# Patient Record
Sex: Female | Born: 1945 | Race: White | Smoking: Never smoker
Health system: Southern US, Community
[De-identification: ages and names within clinical notes are randomized; demographics above are authoritative.]

## PROBLEM LIST (undated history)

## (undated) DIAGNOSIS — I10 Essential (primary) hypertension: Secondary | ICD-10-CM

## (undated) DIAGNOSIS — E039 Hypothyroidism, unspecified: Secondary | ICD-10-CM

## (undated) DIAGNOSIS — E785 Hyperlipidemia, unspecified: Secondary | ICD-10-CM

## (undated) DIAGNOSIS — D696 Thrombocytopenia, unspecified: Secondary | ICD-10-CM

## (undated) HISTORY — PX: LITHOTRIPSY: SUR834

## (undated) HISTORY — DX: Thrombocytopenia, unspecified: D69.6

## (undated) HISTORY — PX: TONSILLECTOMY: SUR1361

## (undated) HISTORY — DX: Essential (primary) hypertension: I10

## (undated) HISTORY — PX: BREAST BIOPSY: SHX20

## (undated) HISTORY — DX: Hypothyroidism, unspecified: E03.9

## (undated) HISTORY — DX: Hyperlipidemia, unspecified: E78.5

---

## 2004-07-10 HISTORY — PX: COLONOSCOPY: SHX174

## 2015-07-11 HISTORY — PX: BREAST BIOPSY: SHX20

## 2016-05-26 ENCOUNTER — Other Ambulatory Visit: Payer: Self-pay | Admitting: Family Medicine

## 2016-05-26 DIAGNOSIS — R921 Mammographic calcification found on diagnostic imaging of breast: Secondary | ICD-10-CM

## 2016-05-31 ENCOUNTER — Other Ambulatory Visit: Payer: Self-pay | Admitting: Family Medicine

## 2016-05-31 DIAGNOSIS — R921 Mammographic calcification found on diagnostic imaging of breast: Secondary | ICD-10-CM

## 2016-06-09 ENCOUNTER — Ambulatory Visit
Admission: RE | Admit: 2016-06-09 | Discharge: 2016-06-09 | Disposition: A | Discharge status: Home / Self Care | Payer: Medicare Other | Source: Ambulatory Visit | Attending: Family Medicine | Admitting: Family Medicine

## 2016-06-09 DIAGNOSIS — R921 Mammographic calcification found on diagnostic imaging of breast: Secondary | ICD-10-CM

## 2017-07-06 ENCOUNTER — Other Ambulatory Visit: Payer: Self-pay | Admitting: Family Medicine

## 2017-07-06 DIAGNOSIS — Z1231 Encounter for screening mammogram for malignant neoplasm of breast: Secondary | ICD-10-CM

## 2017-07-11 ENCOUNTER — Ambulatory Visit
Admission: RE | Admit: 2017-07-11 | Discharge: 2017-07-11 | Disposition: A | Discharge status: Home / Self Care | Payer: Medicare Other | Source: Ambulatory Visit | Attending: Family Medicine | Admitting: Family Medicine

## 2017-07-11 DIAGNOSIS — Z1231 Encounter for screening mammogram for malignant neoplasm of breast: Secondary | ICD-10-CM

## 2018-06-14 ENCOUNTER — Other Ambulatory Visit: Payer: Self-pay | Admitting: Family Medicine

## 2018-06-14 DIAGNOSIS — Z1231 Encounter for screening mammogram for malignant neoplasm of breast: Secondary | ICD-10-CM

## 2018-07-26 ENCOUNTER — Ambulatory Visit
Admission: RE | Admit: 2018-07-26 | Discharge: 2018-07-26 | Disposition: A | Discharge status: Home / Self Care | Payer: Medicare Other | Source: Ambulatory Visit | Attending: Family Medicine | Admitting: Family Medicine

## 2018-07-26 ENCOUNTER — Ambulatory Visit: Payer: Medicare Other

## 2018-07-26 DIAGNOSIS — Z1231 Encounter for screening mammogram for malignant neoplasm of breast: Secondary | ICD-10-CM

## 2019-06-24 ENCOUNTER — Other Ambulatory Visit: Payer: Self-pay | Admitting: Family Medicine

## 2019-06-24 DIAGNOSIS — Z1231 Encounter for screening mammogram for malignant neoplasm of breast: Secondary | ICD-10-CM

## 2019-08-13 ENCOUNTER — Ambulatory Visit: Payer: Medicare Other

## 2019-09-19 ENCOUNTER — Other Ambulatory Visit: Payer: Self-pay

## 2019-09-19 ENCOUNTER — Ambulatory Visit
Admission: RE | Admit: 2019-09-19 | Discharge: 2019-09-19 | Disposition: A | Discharge status: Home / Self Care | Payer: Medicare Other | Source: Ambulatory Visit | Attending: Family Medicine | Admitting: Family Medicine

## 2019-09-19 DIAGNOSIS — Z1231 Encounter for screening mammogram for malignant neoplasm of breast: Secondary | ICD-10-CM

## 2020-06-16 ENCOUNTER — Encounter: Payer: Self-pay | Admitting: Internal Medicine

## 2020-07-26 ENCOUNTER — Ambulatory Visit: Payer: Medicare Other | Admitting: Gastroenterology

## 2020-08-18 ENCOUNTER — Other Ambulatory Visit: Payer: Self-pay | Admitting: Family Medicine

## 2020-08-18 DIAGNOSIS — Z1231 Encounter for screening mammogram for malignant neoplasm of breast: Secondary | ICD-10-CM

## 2020-08-30 ENCOUNTER — Ambulatory Visit: Payer: Medicare Other | Admitting: Gastroenterology

## 2020-10-07 ENCOUNTER — Ambulatory Visit
Admission: RE | Admit: 2020-10-07 | Discharge: 2020-10-07 | Disposition: A | Discharge status: Home / Self Care | Payer: Medicare Other | Source: Ambulatory Visit | Attending: Family Medicine | Admitting: Family Medicine

## 2020-10-07 ENCOUNTER — Other Ambulatory Visit: Payer: Self-pay

## 2020-10-07 DIAGNOSIS — Z1231 Encounter for screening mammogram for malignant neoplasm of breast: Secondary | ICD-10-CM

## 2020-10-18 ENCOUNTER — Other Ambulatory Visit: Payer: Self-pay

## 2020-10-18 ENCOUNTER — Ambulatory Visit (INDEPENDENT_AMBULATORY_CARE_PROVIDER_SITE_OTHER): Payer: Medicare Other | Admitting: Gastroenterology

## 2020-10-18 ENCOUNTER — Encounter: Payer: Self-pay | Admitting: Gastroenterology

## 2020-10-18 DIAGNOSIS — R195 Other fecal abnormalities: Secondary | ICD-10-CM | POA: Insufficient documentation

## 2020-10-18 DIAGNOSIS — D696 Thrombocytopenia, unspecified: Secondary | ICD-10-CM | POA: Diagnosis not present

## 2020-10-18 MED ORDER — SUPREP BOWEL PREP KIT 17.5-3.13-1.6 GM/177ML PO SOLN: 1.0000 | ORAL | 0 refills | Status: AC

## 2020-10-18 NOTE — Progress Notes (Signed)
Primary Care Physician:  Alinda Deem, MD  Primary Gastroenterologist:  Hennie Duos. Marletta Lor, DO   Chief Complaint  Patient presents with  . positive cologuard    Last TCS 15 years ago. No dark stools, no blood In stools    HPI:  Heather Bennett is a 75 y.o. female here at the request of Dr. Loni Dolly, for positive Cologuard.  Patient states she completed test in November or December 2021.  Work-up has been delayed at this point due to bad weather and car troubles.  She is here today to get scheduled for colonoscopy.  Feels well.  Denies any bowel concerns.  No blood in the stool or melena.  No upper GI symptoms.  Intentional 20 pound weight loss since December, using weight watchers.  No family history of colon cancer.  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  Has been stable.  She reports her counts are typically around 110,000.  Denies platelet count below 100,000 to date.    Current Outpatient Medications  Medication Sig Dispense Refill  . atorvastatin (LIPITOR) 10 MG tablet Take 1 tablet by mouth daily.    Marland Kitchen levothyroxine (SYNTHROID) 25 MCG tablet Take 25 mcg by mouth daily.    Marland Kitchen losartan-hydrochlorothiazide (HYZAAR) 50-12.5 MG tablet Take 1 tablet by mouth daily.    . Multiple Vitamin (MULTIVITAMIN) tablet Take 1 tablet by mouth daily.     No current facility-administered medications for this visit.    Allergies as of 10/18/2020  . (No Known Allergies)    Past Medical History:  Diagnosis Date  . HTN (hypertension)   . Hyperlipidemia   . Hypothyroidism   . Thrombocytopenia (HCC)     Past Surgical History:  Procedure Laterality Date  . BREAST BIOPSY    . COLONOSCOPY  2006   [facility]  . LITHOTRIPSY    . TONSILLECTOMY      Family History  Problem Relation Age of Onset  . Breast cancer Maternal Aunt   . Heart defect Father 6  . Colon cancer Neg Hx     Social History   Socioeconomic History  . Marital status: Widowed     Spouse name: Not on file  . Number of children: Not on file  . Years of education: Not on file  . Highest education level: Not on file  Occupational History  . Not on file  Tobacco Use  . Smoking status: Never Smoker  . Smokeless tobacco: Never Used  Substance and Sexual Activity  . Alcohol use: Never  . Drug use: Never  . Sexual activity: Not on file  Other Topics Concern  . Not on file  Social History Narrative  . Not on file   Social Determinants of Health   Financial Resource Strain: Not on file  Food Insecurity: Not on file  Transportation Needs: Not on file  Physical Activity: Not on file  Stress: Not on file  Social Connections: Not on file  Intimate Partner Violence: Not on file      ROS:  General: Negative for anorexia, weight loss, fever, chills, fatigue, weakness. Eyes: Negative for vision changes.  ENT: Negative for hoarseness, difficulty swallowing , nasal congestion. CV: Negative for chest pain, angina, palpitations, dyspnea on exertion, peripheral edema.  Respiratory: Negative for dyspnea at rest, dyspnea on exertion, cough, sputum, wheezing.  GI: See history of present illness. GU:  Negative for dysuria, hematuria, urinary incontinence, urinary frequency, nocturnal urination.  MS: Negative for joint pain, low  back pain.  Derm: Negative for rash or itching.  Neuro: Negative for weakness, abnormal sensation, seizure, frequent headaches, memory loss, confusion.  Psych: Negative for anxiety, depression, suicidal ideation, hallucinations.  Endo: Negative for unusual weight change.  Heme: Negative for bruising or bleeding. Allergy: Negative for rash or hives.    Physical Examination:  BP (!) 180/72 (BP Location: Right Arm, Cuff Size: Normal)   Pulse 88   Temp (!) 97.1 F (36.2 C)   Ht 5' 5.5" (1.664 m)   Wt 172 lb 12.8 oz (78.4 kg)   BMI 28.32 kg/m    General: Well-nourished, well-developed in no acute distress.  Head: Normocephalic, atraumatic.    Eyes: Conjunctiva pink, no icterus. Mouth: masked Neck: Supple without thyromegaly, masses, or lymphadenopathy.  Lungs: Clear to auscultation bilaterally.  Heart: Regular rate and rhythm, no murmurs rubs or gallops.  Abdomen: Bowel sounds are normal, nontender, nondistended, no hepatosplenomegaly or masses, no abdominal bruits or    hernia , no rebound or guarding.   Rectal: not performed Extremities: No lower extremity edema. No clubbing or deformities.  Neuro: Alert and oriented x 4 , grossly normal neurologically.  Skin: Warm and dry, no rash or jaundice.   Psych: Alert and cooperative, normal mood and affect.  Impression/Plan:  Pleasant 75 year old female presenting for management of positive Cologuard.  Last colonoscopy in 2006.  Reports normal exam at that time.  Denies GI concerns.  She is interested in updating colonoscopy at this time in light of positive Cologuard.   Noted to have elevated blood pressure during visit today, some improvement later throughout the visit but still remained up at 180/72.  Patient states this typically happens when she is seen in the doctor's office.  She denies any headache, dizziness, lightheadedness.  H/o chronic thrombocytopenia. Patient reports platelet counts over 100,000.   1. Request copy of last CBC from PCP given thrombocytopenia.   2. Colonoscopy with Dr. Marletta Lor. ASA II.  I have discussed the risks, alternatives, benefits with regards to but not limited to the risk of reaction to medication, bleeding, infection, perforation and the patient is agreeable to proceed. Written consent to be obtained. 3. Encouraged her to take blood pressure at home, if persistent elevation of systolic pressure greater than 160, she should let her PCP know.

## 2020-10-18 NOTE — Patient Instructions (Signed)
1. Colonoscopy as scheduled. See separate instructions.   At [facility] we value your feedback. You may receive a survey about your visit today. Please share your experience as we strive to create trusting relationships with our patients to provide genuine, compassionate, quality care.   We appreciate your understanding and patience as we review any laboratory studies, imaging, and other diagnostic tests that are ordered as we care for you. Our office policy is 5 business days for review of these results, and any emergent or urgent results are addressed in a timely manner for your best interest. If you do not hear from our office in 1 week, please contact us.    We also encourage the use of MyChart, which contains your medical information for your review as well. If you are not enrolled in this feature, an access code is on this after visit summary for your convenience. Thank you for allowing [place] to be involved in your care.

## 2020-10-25 NOTE — Progress Notes (Signed)
Received copy of labs from PCP dated 05/2020:  WBC 4000, H/H 13.5/40.1, Platelets 104,000.

## 2020-11-10 ENCOUNTER — Other Ambulatory Visit (HOSPITAL_COMMUNITY)
Admission: RE | Admit: 2020-11-10 | Discharge: 2020-11-10 | Disposition: A | Discharge status: Home / Self Care | Payer: Medicare Other | Source: Ambulatory Visit | Attending: Internal Medicine | Admitting: Internal Medicine

## 2020-11-10 ENCOUNTER — Other Ambulatory Visit: Payer: Self-pay

## 2020-11-10 DIAGNOSIS — Z01812 Encounter for preprocedural laboratory examination: Secondary | ICD-10-CM | POA: Insufficient documentation

## 2020-11-10 DIAGNOSIS — Z20822 Contact with and (suspected) exposure to covid-19: Secondary | ICD-10-CM | POA: Diagnosis not present

## 2020-11-10 LAB — BASIC METABOLIC PANEL
Anion gap: 8 (ref 5–15)
BUN: 18 mg/dL (ref 8–23)
CO2: 29 mmol/L (ref 22–32)
Calcium: 9.6 mg/dL (ref 8.9–10.3)
Chloride: 101 mmol/L (ref 98–111)
Creatinine, Ser: 0.95 mg/dL (ref 0.44–1.00)
GFR, Estimated: >60 mL/min (ref >60)
Glucose, Bld: 110 mg/dL — ABNORMAL HIGH (ref 70–99)
Potassium: 3.8 mmol/L (ref 3.5–5.1)
Sodium: 138 mmol/L (ref 135–145)

## 2020-11-10 LAB — SARS CORONAVIRUS 2 (TAT 6-24 HRS): SARS Coronavirus 2: NEGATIVE

## 2020-11-11 ENCOUNTER — Telehealth: Payer: Self-pay

## 2020-11-11 ENCOUNTER — Encounter (HOSPITAL_COMMUNITY): Payer: Self-pay | Admitting: Anesthesiology

## 2020-11-11 NOTE — Telephone Encounter (Signed)
Pt called office, she is scheduled for TCS tomorrow. Has took 1st part of prep. She has started having a scratchy throat and nasal drainage. Temp approx 1 hour ago was 100.4. She hasn't taken anything for temp. She wants to know if she needs to cancel procedure?

## 2020-11-11 NOTE — Telephone Encounter (Signed)
noted 

## 2020-11-11 NOTE — Telephone Encounter (Signed)
Dr. Marletta Lor advised TCS should be rescheduled.  Called pt, she doesn't want to reschedule TCS at this time. She wants to wait until she talks to her PCP. She will be going out of town and has appt with PCP in June. Advised her to call office if she wants to reschedule TCS.  Endo scheduler informed to cancel TCS for tomorrow.

## 2020-11-11 NOTE — Telephone Encounter (Signed)
Probably should postpone but will defer to Dr. Marletta Lor.

## 2020-11-12 ENCOUNTER — Ambulatory Visit (HOSPITAL_COMMUNITY): Admission: RE | Admit: 2020-11-12 | Payer: Medicare Other | Source: Home / Self Care

## 2020-11-12 SURGERY — COLONOSCOPY WITH PROPOFOL
Anesthesia: Monitor Anesthesia Care

## 2021-09-09 ENCOUNTER — Other Ambulatory Visit: Payer: Self-pay | Admitting: Family Medicine

## 2021-09-09 DIAGNOSIS — Z1231 Encounter for screening mammogram for malignant neoplasm of breast: Secondary | ICD-10-CM

## 2021-10-10 ENCOUNTER — Ambulatory Visit
Admission: RE | Admit: 2021-10-10 | Discharge: 2021-10-10 | Disposition: A | Discharge status: Home / Self Care | Payer: Medicare Other | Source: Ambulatory Visit | Attending: Family Medicine | Admitting: Family Medicine

## 2021-10-10 DIAGNOSIS — Z1231 Encounter for screening mammogram for malignant neoplasm of breast: Secondary | ICD-10-CM

## 2022-08-05 IMAGING — MG MM DIGITAL SCREENING BILAT W/ TOMO AND CAD
8 series · 8 of 24 positions shown · non-contrast
Comparison: Previous exam(s).

CLINICAL DATA: Screening.

EXAM:
DIGITAL SCREENING BILATERAL MAMMOGRAM WITH TOMOSYNTHESIS AND CAD
TECHNIQUE: Bilateral screening digital craniocaudal and mediolateral oblique
mammograms were obtained. Bilateral screening digital breast
tomosynthesis was performed. The images were evaluated with
computer-aided detection.

[L MLO synth-2D]
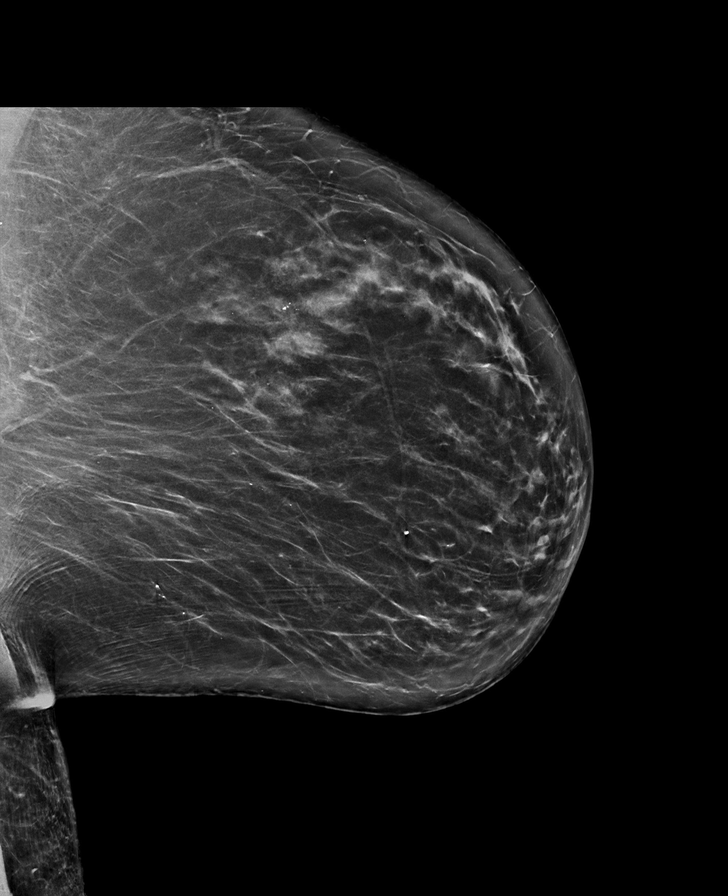

[R CC synth-2D]
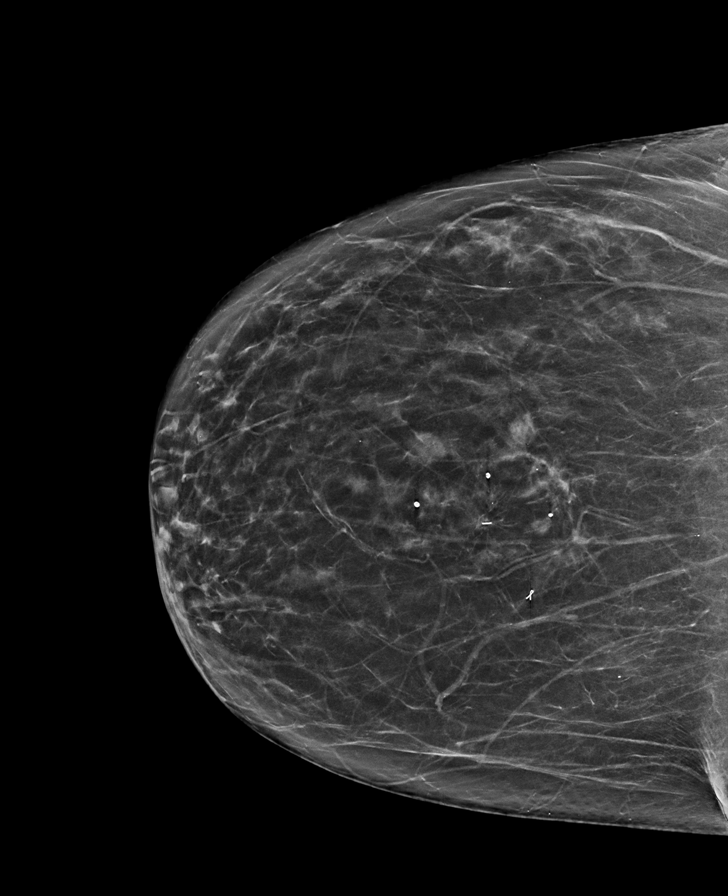

[R MLO synth-2D]
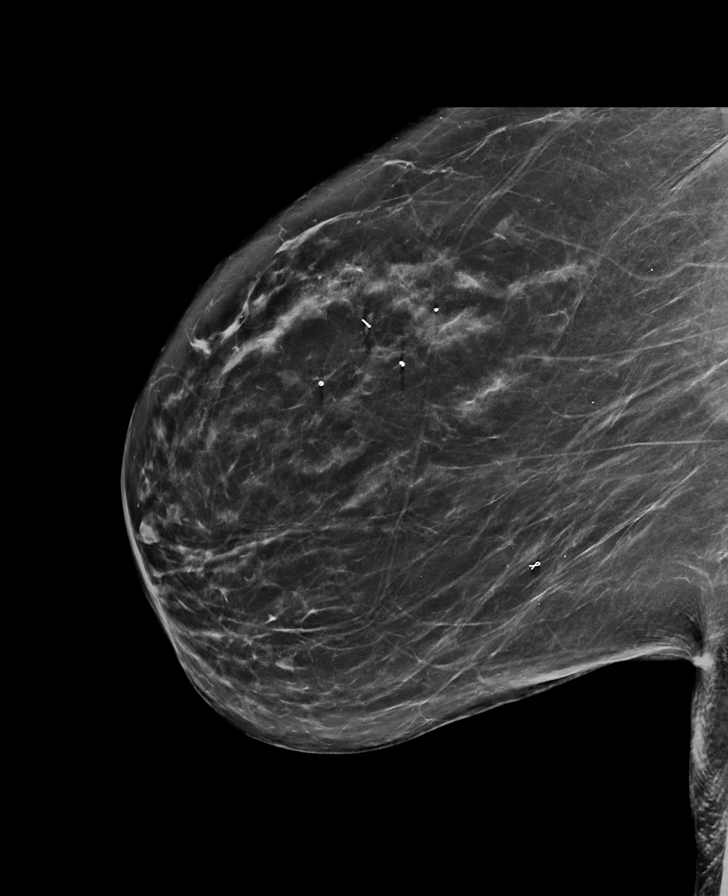

[L CC synth-2D]
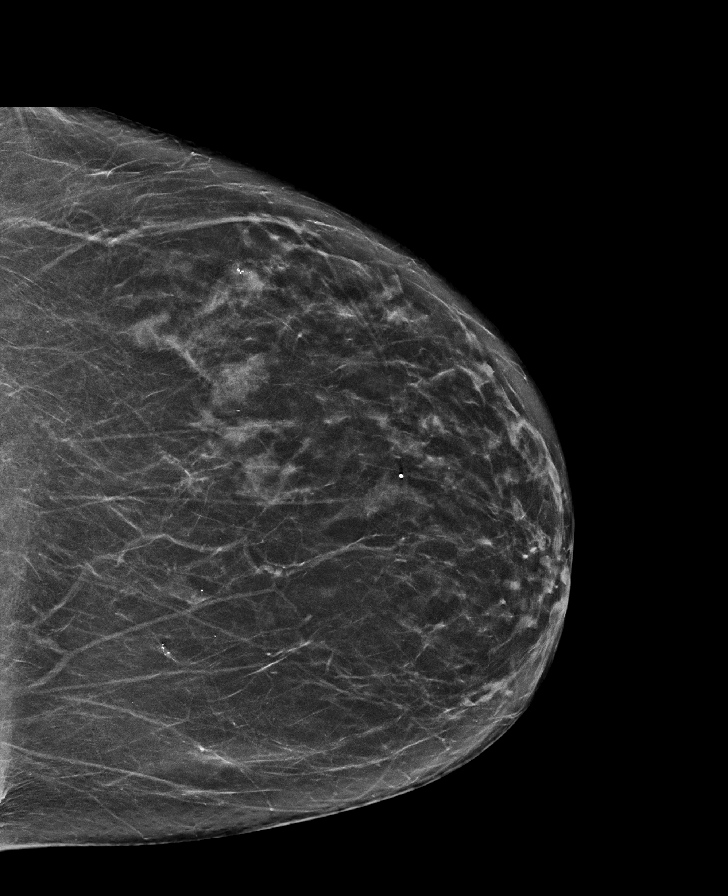

[L CC tomo · tomo slice 34/67.0]
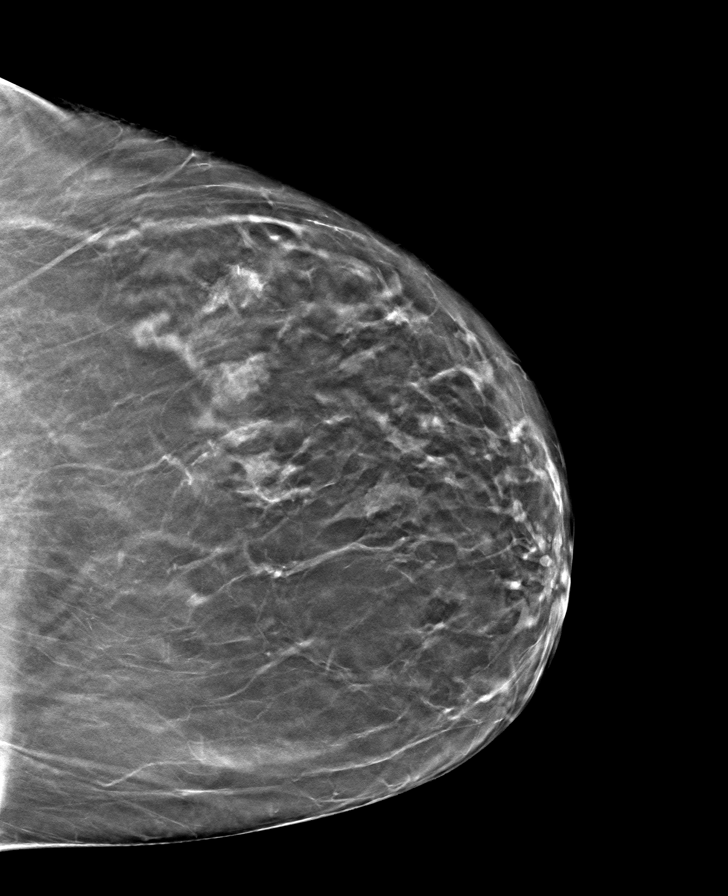

[R MLO tomo · tomo slice 37/74.0]
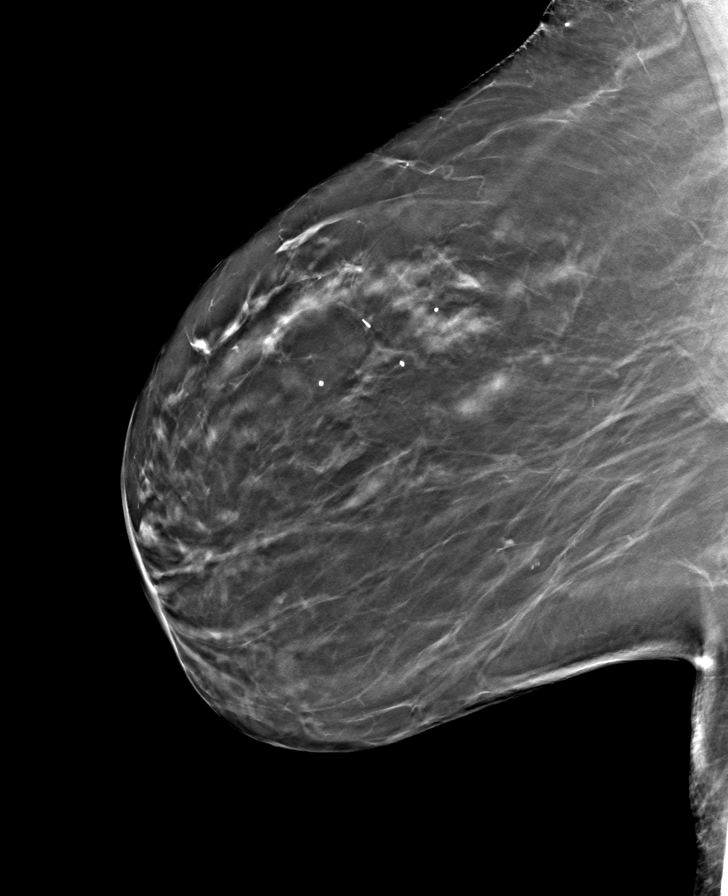

[R CC tomo · tomo slice 35/68.0]
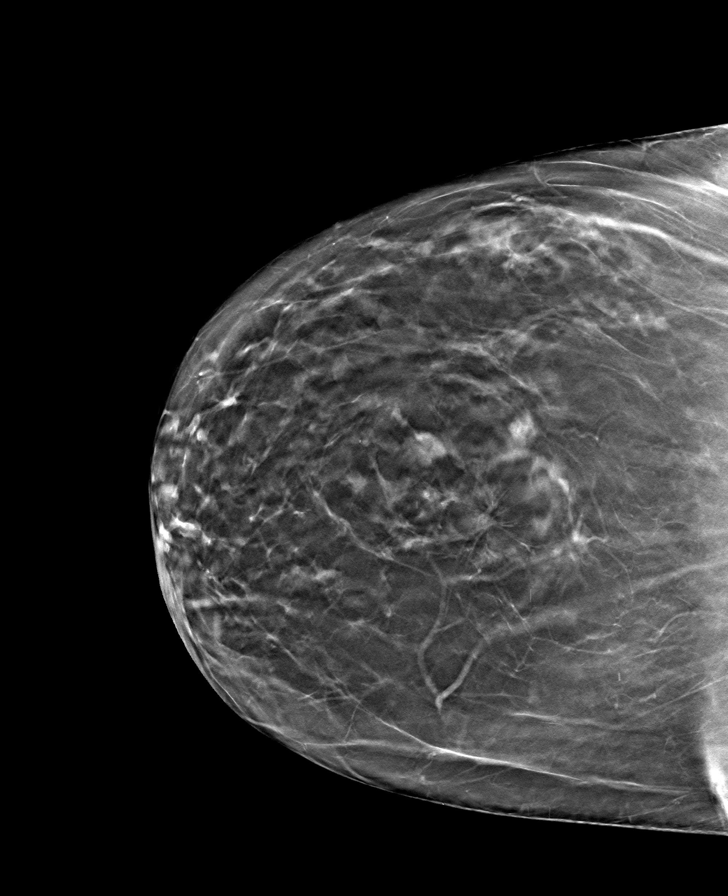

[L MLO tomo · tomo slice 41/80.0]
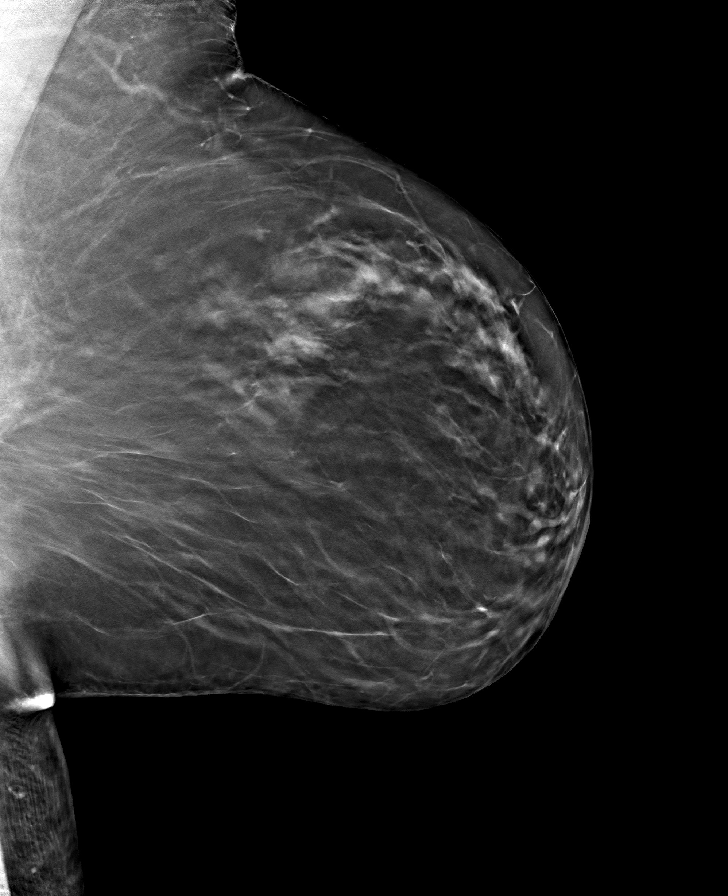

[8 of 24 positions shown; findings below may reference images not displayed]

ACR Breast Density Category b: There are scattered areas of
fibroglandular density.
FINDINGS: There are no findings suspicious for malignancy. The images were
evaluated with computer-aided detection.
IMPRESSION: No mammographic evidence of malignancy. A result letter of this
screening mammogram will be mailed directly to the patient.

RECOMMENDATION:
Screening mammogram in one year. (Code:WJ-I-BG6)

BI-RADS CATEGORY  1: Negative.

## 2022-09-07 ENCOUNTER — Other Ambulatory Visit: Payer: Self-pay | Admitting: Family Medicine

## 2022-09-07 DIAGNOSIS — Z1231 Encounter for screening mammogram for malignant neoplasm of breast: Secondary | ICD-10-CM

## 2022-10-26 ENCOUNTER — Ambulatory Visit
Admission: RE | Admit: 2022-10-26 | Discharge: 2022-10-26 | Disposition: A | Discharge status: Home / Self Care | Payer: Medicare Other | Source: Ambulatory Visit | Attending: Family Medicine | Admitting: Family Medicine

## 2022-10-26 DIAGNOSIS — Z1231 Encounter for screening mammogram for malignant neoplasm of breast: Secondary | ICD-10-CM

## 2022-10-30 ENCOUNTER — Other Ambulatory Visit: Payer: Self-pay | Admitting: Family Medicine

## 2022-10-30 DIAGNOSIS — R928 Other abnormal and inconclusive findings on diagnostic imaging of breast: Secondary | ICD-10-CM

## 2022-11-06 ENCOUNTER — Ambulatory Visit
Admission: RE | Admit: 2022-11-06 | Discharge: 2022-11-06 | Disposition: A | Discharge status: Home / Self Care | Payer: Medicare Other | Source: Ambulatory Visit | Attending: Family Medicine | Admitting: Family Medicine

## 2022-11-06 DIAGNOSIS — R928 Other abnormal and inconclusive findings on diagnostic imaging of breast: Secondary | ICD-10-CM

## 2022-11-07 ENCOUNTER — Other Ambulatory Visit: Payer: Self-pay | Admitting: Family Medicine

## 2022-11-07 DIAGNOSIS — R921 Mammographic calcification found on diagnostic imaging of breast: Secondary | ICD-10-CM

## 2023-05-09 ENCOUNTER — Ambulatory Visit
Admission: RE | Admit: 2023-05-09 | Discharge: 2023-05-09 | Disposition: A | Discharge status: Home / Self Care | Payer: Medicare Other | Source: Ambulatory Visit | Attending: Family Medicine | Admitting: Family Medicine

## 2023-05-09 DIAGNOSIS — R921 Mammographic calcification found on diagnostic imaging of breast: Secondary | ICD-10-CM

## 2023-09-17 ENCOUNTER — Other Ambulatory Visit: Payer: Self-pay | Admitting: Family Medicine

## 2023-09-17 DIAGNOSIS — R921 Mammographic calcification found on diagnostic imaging of breast: Secondary | ICD-10-CM

## 2023-11-08 ENCOUNTER — Encounter

## 2023-11-17 ENCOUNTER — Encounter (HOSPITAL_COMMUNITY): Payer: Self-pay

## 2023-11-24 ENCOUNTER — Ambulatory Visit
Admission: RE | Admit: 2023-11-24 | Discharge: 2023-11-24 | Disposition: A | Discharge status: Home / Self Care | Source: Ambulatory Visit | Attending: Family Medicine | Admitting: Family Medicine

## 2023-11-24 DIAGNOSIS — R921 Mammographic calcification found on diagnostic imaging of breast: Secondary | ICD-10-CM

## 2023-12-06 ENCOUNTER — Encounter
